# Patient Record
Sex: Female | Born: 1955 | Race: White | Hispanic: No | Smoking: Former smoker
Health system: Southern US, Community
[De-identification: ages and names within clinical notes are randomized; demographics above are authoritative.]

## PROBLEM LIST (undated history)

## (undated) DIAGNOSIS — E785 Hyperlipidemia, unspecified: Secondary | ICD-10-CM

## (undated) DIAGNOSIS — I10 Essential (primary) hypertension: Secondary | ICD-10-CM

## (undated) HISTORY — PX: TUBAL LIGATION: SHX77

## (undated) HISTORY — PX: APPENDECTOMY: SHX54

---

## 2015-12-22 ENCOUNTER — Telehealth: Payer: Self-pay | Admitting: Pulmonary Disease

## 2015-12-22 NOTE — Telephone Encounter (Signed)
APT. REMINDER CALL, LMTCB °

## 2015-12-25 ENCOUNTER — Ambulatory Visit: Payer: Self-pay | Admitting: Pulmonary Disease

## 2016-02-12 ENCOUNTER — Encounter: Payer: Self-pay | Admitting: Internal Medicine

## 2016-02-12 ENCOUNTER — Ambulatory Visit (INDEPENDENT_AMBULATORY_CARE_PROVIDER_SITE_OTHER): Payer: Self-pay | Admitting: Internal Medicine

## 2016-02-12 DIAGNOSIS — IMO0001 Reserved for inherently not codable concepts without codable children: Secondary | ICD-10-CM

## 2016-02-12 DIAGNOSIS — M1712 Unilateral primary osteoarthritis, left knee: Secondary | ICD-10-CM

## 2016-02-12 DIAGNOSIS — R030 Elevated blood-pressure reading, without diagnosis of hypertension: Secondary | ICD-10-CM

## 2016-02-12 DIAGNOSIS — M25562 Pain in left knee: Secondary | ICD-10-CM

## 2016-02-12 DIAGNOSIS — I10 Essential (primary) hypertension: Secondary | ICD-10-CM | POA: Insufficient documentation

## 2016-02-12 MED ORDER — DICLOFENAC SODIUM 1 % TD GEL: 4.0000 g | Freq: Four times a day (QID) | TRANSDERMAL | 2 refills | Status: AC

## 2016-02-12 NOTE — Progress Notes (Signed)
   CC: left knee pain HPI: [name] is a 60 y.o. woman with no significant PMH here for left knee pain.  Please see Problem List/A&P for the status of the patient's chronic medical problems   No past medical history on file.  Review of Systems:  Constitutional: Negative for fever, chills, has gained 30 lbs over last year which she attributes to her diet Respiratory: Negative for cough, shortness of breath and wheezing.  Musculoskeletal: Negative for myalgias, has left knee pain  Physical Exam: Vitals:   02/12/16 0925  BP: (!) 153/103  Pulse: 72  Temp: 98.2 F (36.8 C)  TempSrc: Oral  SpO2: 97%  Weight: 170 lb (77.1 kg)  Height: 5' 5.5" (1.664 m)    General: A&O, in NAD CV: RRR, normal s1, s2, no m/r/g Resp: equal and symmetric breath sounds, no wheezing or crackles  Abdomen: soft, nontender, nondistended, +BS Extremities: pulses intact b/l, no edema,  Left knee: nontender to palpation, no effusion. Has crepitus on extension,. Negative anterior and posterior drawer test   Assessment & Plan:   See encounters tab for problem based medical decision making. Patient discussed with Dr. Criselda Peaches

## 2016-02-12 NOTE — Assessment & Plan Note (Signed)
Patient presents with few months duration of left knee pain. She said she had a torn meniscus and subsequent surgery at least 10 years ago. She said her husband had passed away few years ago, and after that she went into depression and as a result gained a lot of weight and her knee pain worsened after that. SHe lost some of that weight but still is 30 lbs more than her baseline of 140 lbs.  She said the pain is off and on and is dull. She takes ibuprofen and tylenol intermittently which helps the pain. THe pain is worse after walking and improves when sitting down.  She denies any acute recent injury or fall. She denies any other joint pain. She says the pain is there for about few months now and now has associated back and leg pain.  On exam, significant crepitus of the left knee on ROM. No effusions, erythema or warmth.   A: Osteoarthritis of left knee  P: -Xray of left knee -Voltaren gel -aerobic low impact exercise- explained bicycling, swimming and walking -can take tylenol not to exceed 3g in 24 hours -RTC in 2-4 weeks for follow up for her elevated BP

## 2016-02-12 NOTE — Assessment & Plan Note (Signed)
BP of 153/100. She said she used to be on hctz >10 years ago but not on it. SHe denies visiting a physician since then. Denies any headaches or blurry vision.  -Will need several elevated BP readings to diagnose hypertension.   -Explained to measure BP whenever she goes to a pharmacy as she can not afford a cuff, and write it down in log -RTC in 2-4 weeks for repeat BP - Can start hctz if confirmed HTN, BMET prior to that

## 2016-02-12 NOTE — Patient Instructions (Signed)
Thank you for your visit today Please do the xray of the left knee Please use voltaren gel as needed Please limit the use of tylenol and ibuprofen Please do aerobic exercise but avoid repetitive motion of the knee Please follow up in 2-4 weeks

## 2016-02-21 NOTE — Progress Notes (Signed)
Internal Medicine Clinic Attending  Case discussed with Dr. Saraiya at the time of the visit.  We reviewed the resident's history and exam and pertinent patient test results.  I agree with the assessment, diagnosis, and plan of care documented in the resident's note.  

## 2016-02-29 ENCOUNTER — Telehealth: Payer: Self-pay | Admitting: *Deleted

## 2016-02-29 NOTE — Telephone Encounter (Signed)
"  I was walking up the stairs and something popped in my knee." Pain in knee has significantly increased. Patient went to [employer] to check blood pressure and had a reading of 171/100. Patient took B/P immediately after getting inside and said her knee was hurting "terribly". She thinks her B/P is elevated due to pain. Denies HA, blurry vision, chest pain or SOB. Says "I feel fine except my knee". Offered patient an appointment but declined due to financial reasons and did not want to drive. Offered to move next appointment up and patient still declined because she does not get paid until 03/08/2016. Encouraged patient to call us back if pain worsens or to go to ED for any emergent symptoms. Such as chest pain, shortness of breath, weakness, severe HA. Patient agreed. Encouraged patient to use Voltaren gel and rest the knee and try ice to relieve pain.

## 2016-03-01 ENCOUNTER — Encounter (HOSPITAL_COMMUNITY): Payer: Self-pay | Admitting: Emergency Medicine

## 2016-03-01 ENCOUNTER — Emergency Department (HOSPITAL_COMMUNITY)
Admission: EM | Admit: 2016-03-01 | Discharge: 2016-03-01 | Disposition: A | Discharge status: Home / Self Care | Payer: Self-pay | Attending: Emergency Medicine | Admitting: Emergency Medicine

## 2016-03-01 ENCOUNTER — Emergency Department (HOSPITAL_COMMUNITY): Payer: Self-pay

## 2016-03-01 DIAGNOSIS — Z87891 Personal history of nicotine dependence: Secondary | ICD-10-CM | POA: Insufficient documentation

## 2016-03-01 DIAGNOSIS — I10 Essential (primary) hypertension: Secondary | ICD-10-CM | POA: Insufficient documentation

## 2016-03-01 DIAGNOSIS — M25562 Pain in left knee: Secondary | ICD-10-CM | POA: Insufficient documentation

## 2016-03-01 LAB — CBC WITH DIFFERENTIAL/PLATELET
BASOS ABS: 0 10*3/uL (ref 0.0–0.1)
BASOS PCT: 0 %
EOS ABS: 0.1 10*3/uL (ref 0.0–0.7)
EOS PCT: 2 %
HCT: 45.4 % (ref 36.0–46.0)
HEMOGLOBIN: 15.5 g/dL — AB (ref 12.0–15.0)
LYMPHS ABS: 2.8 10*3/uL (ref 0.7–4.0)
Lymphocytes Relative: 34 %
MCH: 30.4 pg (ref 26.0–34.0)
MCHC: 34.1 g/dL (ref 30.0–36.0)
MCV: 89 fL (ref 78.0–100.0)
Monocytes Absolute: 0.7 10*3/uL (ref 0.1–1.0)
Monocytes Relative: 9 %
NEUTROS PCT: 55 %
Neutro Abs: 4.5 10*3/uL (ref 1.7–7.7)
PLATELETS: 283 10*3/uL (ref 150–400)
RBC: 5.1 MIL/uL (ref 3.87–5.11)
RDW: 12.4 % (ref 11.5–15.5)
WBC: 8.1 10*3/uL (ref 4.0–10.5)

## 2016-03-01 LAB — BASIC METABOLIC PANEL
ANION GAP: 6 (ref 5–15)
BUN: 16 mg/dL (ref 6–20)
CHLORIDE: 104 mmol/L (ref 101–111)
CO2: 27 mmol/L (ref 22–32)
Calcium: 9.7 mg/dL (ref 8.9–10.3)
Creatinine, Ser: 0.74 mg/dL (ref 0.44–1.00)
Glucose, Bld: 93 mg/dL (ref 65–99)
POTASSIUM: 4.2 mmol/L (ref 3.5–5.1)
SODIUM: 137 mmol/L (ref 135–145)

## 2016-03-01 MED ORDER — HYDROCODONE-ACETAMINOPHEN 5-325 MG PO TABS: 1.0000 | ORAL_TABLET | Freq: Four times a day (QID) | ORAL | 0 refills | Status: AC | PRN

## 2016-03-01 MED ORDER — HYDROCHLOROTHIAZIDE 25 MG PO TABS: 25.0000 mg | ORAL_TABLET | Freq: Every day | ORAL | 1 refills | Status: DC

## 2016-03-01 MED ORDER — HYDROMORPHONE HCL 1 MG/ML IJ SOLN
2.0000 mg | Freq: Once | INTRAMUSCULAR | Status: AC
Start: 2016-03-01 — End: 2016-03-01
  Administered 2016-03-01: 2 mg via INTRAMUSCULAR
  Filled 2016-03-01: qty 2

## 2016-03-01 NOTE — ED Triage Notes (Signed)
[facility]  Has a h/a is  On no meds  Also her knee is in pain from when she hurt it walking her dog  2 days ago

## 2016-03-01 NOTE — Discharge Instructions (Signed)
Make an appointment to follow-up with the internal medicine clinic for recheck your blood pressure. Start the hydrochlorothiazide for the elevated blood pressure take 1 pill daily. For the left knee pain take the hydrocodone as needed.

## 2016-03-01 NOTE — ED Provider Notes (Signed)
[facility] DEPT Provider Note   CSN: [id number] Arrival date & time: 03/01/16  1321     History   Chief Complaint Chief Complaint  Patient presents with  . Knee Pain  . Hypertension    HPI Karla Johnson is a 60 y.o. female.  [facility]. Patient's been being evaluated for possible hypertension. In the following her blood pressures. Patient with increased pain today with her left knee. She's had trouble with that in the past had a twisting injury but no acute injury to the knee. Patient with increased pain. Blood pressure also elevated [facility]. Patient denies chest pain shortness of breath. No neurological focal deficits. No significant headache. Patient states there was a twisting injury to the knee. With a popping sensation 2 days ago. Did not completely fall.      History reviewed. No pertinent past medical history.  Patient Active Problem List   Diagnosis Date Noted  . Left knee pain 02/12/2016  . Elevated blood pressure 02/12/2016    History reviewed. No pertinent surgical history.  OB History    No data available       Home Medications    Prior to Admission medications   Medication Sig Start Date End Date Taking? Authorizing Provider  ibuprofen (ADVIL,MOTRIN) 200 MG tablet Take 200 mg by mouth every 6 (six) hours as needed.   Yes Historical Provider, MD  diclofenac sodium (VOLTAREN) 1 % GEL Apply 4 g topically 4 (four) times daily. Patient not taking: Reported on 03/01/2016 02/12/16   Deneise LeverParth Saraiya, MD  hydrochlorothiazide (HYDRODIURIL) 25 MG tablet Take 1 tablet (25 mg total) by mouth daily. 03/01/16   Vanetta MuldersScott Millard Bautch, MD  HYDROcodone-acetaminophen (NORCO/VICODIN) 5-325 MG tablet Take 1-2 tablets by mouth every 6 (six) hours as needed for moderate pain. 03/01/16   Vanetta MuldersScott Floris Neuhaus, MD    Family History No family history on file.  Social History Social  History  Substance Use Topics  . Smoking status: Former Smoker    Quit date: 02/11/1977  . Smokeless tobacco: Never Used  . Alcohol use Not on file     Allergies   Review of patient's allergies indicates no known allergies.   Review of Systems Review of Systems  Constitutional: Negative for fever.  HENT: Negative for congestion.   Eyes: Negative for visual disturbance.  Respiratory: Negative for shortness of breath.   Cardiovascular: Negative for chest pain and leg swelling.  Gastrointestinal: Negative for abdominal pain.  Genitourinary: Negative for dysuria.  Musculoskeletal: Positive for arthralgias and joint swelling.  Skin: Negative for rash.  Neurological: Negative for weakness, numbness and headaches.  Hematological: Does not bruise/bleed easily.  Psychiatric/Behavioral: Negative for confusion.     Physical Exam Updated Vital Signs BP 162/95   Pulse (!) 57   Temp 98.4 F (36.9 C) (Oral)   Resp 19   Ht 5\' 6"  (1.676 m)   Wt 77.1 kg   SpO2 93%   BMI 27.44 kg/m   Physical Exam  Constitutional: She is oriented to person, place, and time. She appears well-developed and well-nourished. No distress.  HENT:  Head: Normocephalic and atraumatic.  Eyes: Conjunctivae and EOM are normal. Pupils are equal, round, and reactive to light.  Neck: Normal range of motion. Neck supple.  Cardiovascular: Normal rate, regular rhythm, normal heart sounds and intact distal pulses.   Pulmonary/Chest: Effort normal and breath sounds normal.  Abdominal: Soft. Bowel sounds  are normal.  Musculoskeletal: Normal range of motion. She exhibits edema.  Left knee with very slight edema. No patellar dislocation no joint line tenderness. Good range of motion. No erythema edema. Distally neurovascularly intact. Right knee without any significant abnormalities as well.  Neurological: She is alert and oriented to person, place, and time. No cranial nerve deficit. She exhibits normal muscle tone.  Coordination normal.  Skin: Skin is warm. No erythema.  Nursing note and vitals reviewed.    ED Treatments / Results  Labs (all labs ordered are listed, but only abnormal results are displayed) Labs Reviewed  CBC WITH DIFFERENTIAL/PLATELET - Abnormal; Notable for the following:       Result Value   Hemoglobin 15.5 (*)    All other components within normal limits  BASIC METABOLIC PANEL   Results for orders placed or performed during the hospital encounter of 03/01/16  CBC with Differential/Platelet  Result Value Ref Range   WBC 8.1 4.0 - 10.5 K/uL   RBC 5.10 3.87 - 5.11 MIL/uL   Hemoglobin 15.5 (H) 12.0 - 15.0 g/dL   HCT 78.245.4 95.636.0 - 21.346.0 %   MCV 89.0 78.0 - 100.0 fL   MCH 30.4 26.0 - 34.0 pg   MCHC 34.1 30.0 - 36.0 g/dL   RDW 08.612.4 57.811.5 - 46.915.5 %   Platelets 283 150 - 400 K/uL   Neutrophils Relative % 55 %   Neutro Abs 4.5 1.7 - 7.7 K/uL   Lymphocytes Relative 34 %   Lymphs Abs 2.8 0.7 - 4.0 K/uL   Monocytes Relative 9 %   Monocytes Absolute 0.7 0.1 - 1.0 K/uL   Eosinophils Relative 2 %   Eosinophils Absolute 0.1 0.0 - 0.7 K/uL   Basophils Relative 0 %   Basophils Absolute 0.0 0.0 - 0.1 K/uL  Basic metabolic panel  Result Value Ref Range   Sodium 137 135 - 145 mmol/L   Potassium 4.2 3.5 - 5.1 mmol/L   Chloride 104 101 - 111 mmol/L   CO2 27 22 - 32 mmol/L   Glucose, Bld 93 65 - 99 mg/dL   BUN 16 6 - 20 mg/dL   Creatinine, Ser 6.290.74 0.44 - 1.00 mg/dL   Calcium 9.7 8.9 - 52.810.3 mg/dL   GFR calc non Af Amer >60 >60 mL/min   GFR calc Af Amer >60 >60 mL/min   Anion gap 6 5 - 15     EKG  EKG Interpretation None       Radiology Dg Knee Complete 4 Views Left  Result Date: 03/01/2016 CLINICAL DATA:  Pain and swelling in left knee. Twisting injury 2 days ago. EXAM: LEFT KNEE - COMPLETE 4+ VIEW COMPARISON:  None. FINDINGS: Degenerative changes in all 3 compartments of the left knee with joint space narrowing and spurring. No acute bony abnormality. Specifically, no  fracture, subluxation, or dislocation. Soft tissues are intact. No joint effusion. IMPRESSION: Moderate tricompartment degenerative changes. No acute bony abnormality. Electronically Signed   By: Charlett NoseKevin  Dover M.D.   On: 03/01/2016 15:51    Procedures Procedures (including critical care time)  Medications Ordered in ED Medications  HYDROmorphone (DILAUDID) injection 2 mg (2 mg Intramuscular Given 03/01/16 1914)     Initial Impression / Assessment and Plan / ED Course  I have reviewed the triage vital signs and the nursing notes.  Pertinent labs & imaging results that were available during my care of the patient were reviewed by me and considered in my medical decision making (see chart  for details).  Clinical Course    Patient with complaint of left knee pain on and off for a period of time. X-rays consistent with arthritic changes there. Patient [place] treated with when necessary hydrocodone. Patient also was followed by internal medicine clinic here [facility] and patient has been told that she has had elevated blood pressure. They have been following that. Based on her findings today most likely does have hypertension requiring blood pressure medication. Did not improve significantly with control of the pain from the left knee. Patient did receive 1 mg of hydromorphone. This improved the left knee pain but did not make a significant dent in the open to control her blood pressure. So patient probably truly is hypertensive. Patient's basic labs without any significant electrolyte abnormalities. We'll start patient on hydrochlorothiazide. She will follow up with the internal medicine clinic.   Final Clinical Im 5 family pressions(s) / ED Diagnoses   Final diagnoses:  Left knee pain  Essential hypertension    New Prescriptions New Prescriptions   HYDROCHLOROTHIAZIDE (HYDRODIURIL) 25 MG TABLET    Take 1 tablet (25 mg total) by mouth daily.   HYDROCODONE-ACETAMINOPHEN (NORCO/VICODIN) 5-325  MG TABLET    Take 1-2 tablets by mouth every 6 (six) hours as needed for moderate pain.     Vanetta Mulders, MD 03/01/16 2013

## 2016-03-07 ENCOUNTER — Telehealth: Payer: Self-pay | Admitting: Internal Medicine

## 2016-03-07 NOTE — Telephone Encounter (Signed)
APT. REMINDER CALL, LMTCB °

## 2016-03-08 ENCOUNTER — Telehealth: Payer: Self-pay

## 2016-03-08 ENCOUNTER — Ambulatory Visit (INDEPENDENT_AMBULATORY_CARE_PROVIDER_SITE_OTHER): Payer: Self-pay | Admitting: Internal Medicine

## 2016-03-08 DIAGNOSIS — I10 Essential (primary) hypertension: Secondary | ICD-10-CM

## 2016-03-08 DIAGNOSIS — Z79899 Other long term (current) drug therapy: Secondary | ICD-10-CM

## 2016-03-08 MED ORDER — HYDROCHLOROTHIAZIDE 25 MG PO TABS: 25.0000 mg | ORAL_TABLET | Freq: Every day | ORAL | 1 refills | Status: DC

## 2016-03-08 MED ORDER — NAPROXEN 500 MG PO TABS: 500.0000 mg | ORAL_TABLET | Freq: Two times a day (BID) | ORAL | 0 refills | Status: AC

## 2016-03-08 NOTE — Telephone Encounter (Signed)
States she was seen today- needs something for knee pain not necessary Hydrocdone. Talked w/ Dr Yisroel RammingSarariya who states he will speak to pt.

## 2016-03-08 NOTE — Patient Instructions (Signed)
Thank you for your visit today  Please continue taking the hydrochlorothiazide medicine for your blood pressure  Please continue recording your blood pressure at home (in [facility], or if you buy a cuff then at home)- it is the best way for us to know what your blood pressure runs like  Please follow up in 4 weeks

## 2016-03-08 NOTE — Telephone Encounter (Signed)
Questions about hydrocodone. Please call pt back.

## 2016-03-08 NOTE — Progress Notes (Signed)
   CC:  Hypertension follow up HPI: [name] is a 60 y.o. woman with PMH noted below here for hypertension follow up  Please see Problem List/A&P for the status of the patient's chronic medical problems   No past medical history on file.  Review of Systems:  Constitutional: Negative for fever, chills, weight loss and malaise/fatigue.  Denies n/v Has some left knee pain but better   Physical Exam: Vitals:   03/08/16 1039 03/08/16 1041  BP: (!) 165/92 130/80  Pulse: 91 70  Temp: 98.7 F (37.1 C)   TempSrc: Oral   SpO2: 99%   Weight: 167 lb 12.8 oz (76.1 kg)   Height: 5' 5.5" (1.664 m)     General: A&O, in NAD, knee has a brace CV: RRR, normal s1, s2, no m/r/g, no carotid bruits appreciated Resp: equal and symmetric breath sounds, no wheezing or crackles  Abdomen: soft, nontender, nondistended, +BS   Assessment & Plan:   See encounters tab for problem based medical decision making. Patient discussed with Dr. Criselda Johnson

## 2016-03-08 NOTE — Progress Notes (Signed)
Internal Medicine Clinic Attending  Case discussed with Dr. Saraiya at the time of the visit.  We reviewed the resident's history and exam and pertinent patient test results.  I agree with the assessment, diagnosis, and plan of care documented in the resident's note.  

## 2016-03-08 NOTE — Assessment & Plan Note (Signed)
At the last visit on July 31, pt's blood pressure was elevated and she was asked to keep a log with few readings to see if her at home blood pressure continues to be elevated.  Her at-home readings were int he 160 and 150 and diastolic in the 90s.[facility]  She went to ER on Aug 18th for knee pain and they had started her on HCTZ 25 mg daily.  Today her initial blood pressure is 165/92, and repeat number is 130/80, which is at goal.  Assessment: Essential hypertension, controlled on hctz  Plan -continue hctz 25 mg daily -asked her to continue monitoring BP at home and advised her to buy a meter for home and bring it to clinic for calibration -advised low salt diet,exercise. Pt does not smoke now or in past

## 2016-03-08 NOTE — Telephone Encounter (Signed)
Returned pt's call - no answer; message left.

## 2016-03-08 NOTE — Addendum Note (Signed)
Addended by: Deneise LeverSARAIYA, Perfecto Purdy A on: 03/08/2016 03:51 PM   Modules accepted: Orders

## 2016-04-29 ENCOUNTER — Other Ambulatory Visit: Payer: Self-pay | Admitting: Internal Medicine

## 2016-05-10 ENCOUNTER — Other Ambulatory Visit: Payer: Self-pay | Admitting: Obstetrics and Gynecology

## 2016-05-10 DIAGNOSIS — Z1231 Encounter for screening mammogram for malignant neoplasm of breast: Secondary | ICD-10-CM

## 2016-05-20 ENCOUNTER — Emergency Department (HOSPITAL_COMMUNITY)
Admission: EM | Admit: 2016-05-20 | Discharge: 2016-05-20 | Disposition: A | Discharge status: Home / Self Care | Payer: Self-pay | Attending: Emergency Medicine | Admitting: Emergency Medicine

## 2016-05-20 ENCOUNTER — Encounter (HOSPITAL_COMMUNITY): Payer: Self-pay | Admitting: *Deleted

## 2016-05-20 DIAGNOSIS — Z87891 Personal history of nicotine dependence: Secondary | ICD-10-CM | POA: Insufficient documentation

## 2016-05-20 DIAGNOSIS — Z79899 Other long term (current) drug therapy: Secondary | ICD-10-CM | POA: Insufficient documentation

## 2016-05-20 DIAGNOSIS — K0889 Other specified disorders of teeth and supporting structures: Secondary | ICD-10-CM | POA: Insufficient documentation

## 2016-05-20 DIAGNOSIS — I10 Essential (primary) hypertension: Secondary | ICD-10-CM | POA: Insufficient documentation

## 2016-05-20 HISTORY — DX: Essential (primary) hypertension: I10

## 2016-05-20 MED ORDER — LIDOCAINE VISCOUS 2 % MT SOLN: 15.0000 mL | OROMUCOSAL | 2 refills | Status: AC | PRN

## 2016-05-20 MED ORDER — NAPROXEN 500 MG PO TABS: 500.0000 mg | ORAL_TABLET | Freq: Two times a day (BID) | ORAL | 0 refills | Status: AC

## 2016-05-20 MED ORDER — PENICILLIN V POTASSIUM 500 MG PO TABS: 500.0000 mg | ORAL_TABLET | Freq: Four times a day (QID) | ORAL | 0 refills | Status: AC

## 2016-05-20 MED ORDER — BUPIVACAINE-EPINEPHRINE (PF) 0.5% -1:200000 IJ SOLN
1.8000 mL | Freq: Once | INTRAMUSCULAR | Status: AC
  Administered 2016-05-20: 1.8 mL
  Filled 2016-05-20: qty 1.8

## 2016-05-20 NOTE — ED Triage Notes (Signed)
PT reports a week HX of dental pain and facial swelling.

## 2016-05-20 NOTE — ED Notes (Signed)
Declined W/C at D/C and was escorted to lobby by RN. 

## 2016-05-20 NOTE — Discharge Instructions (Signed)
You have been seen today for dental pain. You should follow up with a dentist as soon as possible. This problem will not resolve on its own without the care of a dentist. Use ibuprofen or naproxen for pain. Take all of the antibiotics in their entirety. Use the viscous lidocaine for mouth pain. Swish with the lidocaine and spit it out. Do not swallow it.

## 2016-05-20 NOTE — ED Provider Notes (Signed)
[facility] Provider Note   CSN: [id number] Arrival date & time: 05/20/16  [id number]     History   Chief Complaint Chief Complaint  Patient presents with  . Dental Pain    HPI Karla Johnson is a 60 y.o. female.  HPI   Karla Johnson is a 60 y.o. female, with a history of HTN, presenting to the ED with left lower dental pain for the past week. Also endorses some facial swelling. Pain is throbbing and severe. Has tried swishing with peroxide and taking ibuprofen with no improvement. Patient is currently trying to get in with a dentist, but has not been successful. Denies fever/chills, N/V, difficulty breathing or swallowing, or any other complaints.       Past Medical History:  Diagnosis Date  . Hypertension     Patient Active Problem List   Diagnosis Date Noted  . Left knee pain 02/12/2016  . Essential hypertension 02/12/2016    History reviewed. No pertinent surgical history.  OB History    No data available       Home Medications    Prior to Admission medications   Medication Sig Start Date End Date Taking? Authorizing Provider  diclofenac sodium (VOLTAREN) 1 % GEL Apply 4 g topically 4 (four) times daily. Patient not taking: Reported on 03/01/2016 02/12/16   Deneise LeverParth Saraiya, MD  hydrochlorothiazide (HYDRODIURIL) 25 MG tablet TAKE ONE TABLET BY MOUTH ONCE DAILY 05/03/16   Bethany Molt, DO  HYDROcodone-acetaminophen (NORCO/VICODIN) 5-325 MG tablet Take 1-2 tablets by mouth every 6 (six) hours as needed for moderate pain. 03/01/16   Vanetta MuldersScott Zackowski, MD  ibuprofen (ADVIL,MOTRIN) 200 MG tablet Take 200 mg by mouth every 6 (six) hours as needed.    Historical Provider, MD  lidocaine (XYLOCAINE) 2 % solution Use as directed 15 mLs in the mouth or throat as needed for mouth pain. 05/20/16   Kayin Kettering C Dandy Lazaro, PA-C  naproxen (NAPROSYN) 500 MG tablet Take 1 tablet (500 mg total) by mouth 2 (two) times daily with a meal. 03/08/16 03/08/17  Deneise LeverParth Saraiya, MD  naproxen (NAPROSYN)  500 MG tablet Take 1 tablet (500 mg total) by mouth 2 (two) times daily. 05/20/16   Iqra Rotundo C Shalik Sanfilippo, PA-C  penicillin v potassium (VEETID) 500 MG tablet Take 1 tablet (500 mg total) by mouth 4 (four) times daily. 05/20/16 05/27/16  Anselm PancoastShawn C Yaquelin Langelier, PA-C    Family History History reviewed. No pertinent family history.  Social History Social History  Substance Use Topics  . Smoking status: Former Smoker    Quit date: 02/11/1977  . Smokeless tobacco: Never Used  . Alcohol use No     Allergies   Patient has no known allergies.   Review of Systems Review of Systems  Constitutional: Negative for chills and fever.  HENT: Positive for dental problem and facial swelling (subjective). Negative for trouble swallowing and voice change.   Respiratory: Negative for shortness of breath.   Gastrointestinal: Negative for nausea and vomiting.  All other systems reviewed and are negative.    Physical Exam Updated Vital Signs BP 164/97 (BP Location: Right Arm)   Pulse 81   Temp 98.2 F (36.8 C) (Oral)   Resp 16   Ht 5\' 6"  (1.676 m)   Wt 76.2 kg   SpO2 100%   BMI 27.12 kg/m   Physical Exam  Constitutional: She appears well-developed and well-nourished. No distress.  HENT:  Head: Normocephalic and atraumatic.  Tenderness near the left mandibular first premolar. Poor dentition throughout.  Multiple teeth worn down to the gum line. No discernable area of fluctuance to suggest a drainable abscess. No trismus. Readily handles oral secretions without difficulty.  Eyes: Conjunctivae are normal.  Neck: Normal range of motion. Neck supple.  Cardiovascular: Normal rate, regular rhythm and intact distal pulses.   Pulmonary/Chest: Effort normal and breath sounds normal. No respiratory distress.  Abdominal: Soft. There is no tenderness. There is no guarding.  Musculoskeletal: She exhibits no edema.  Lymphadenopathy:    She has no cervical adenopathy.  Neurological: She is alert.  Skin: Skin is warm and  dry. She is not diaphoretic.  Psychiatric: She has a normal mood and affect. Her behavior is normal.  Nursing note and vitals reviewed.    ED Treatments / Results  Labs (all labs ordered are listed, but only abnormal results are displayed) Labs Reviewed - No data to display  EKG  EKG Interpretation None       Radiology No results found.  Procedures Dental Block Date/Time: 05/20/2016 7:38 AM Performed by: Anselm PancoastJOY, Kodee Ravert C Authorized by: Anselm PancoastJOY, Levaughn Puccinelli C   Consent:    Consent obtained:  Verbal   Consent given by:  Patient   Risks discussed:  Allergic reaction, infection, swelling, unsuccessful block and pain   Alternatives discussed:  No treatment and alternative treatment Indications:    Indications: dental pain   Location:    Block type:  Inferior alveolar   Laterality:  Left Procedure details (see MAR for exact dosages):    Syringe type:  Controlled syringe   Needle gauge:  27 G   Anesthetic injected:  Bupivacaine 0.5% WITH epi   Injection procedure:  Anatomic landmarks identified, anatomic landmarks palpated, introduced needle and incremental injection Post-procedure details:    Outcome:  Pain improved   Patient tolerance of procedure:  Tolerated well, no immediate complications    (including critical care time)  Medications Ordered in ED Medications  bupivacaine-epinephrine (MARCAINE W/ EPI) 0.5% -1:200000 injection 1.8 mL (1.8 mLs Infiltration Given 05/20/16 [id number])     Initial Impression / Assessment and Plan / ED Course  I have reviewed the triage vital signs and the nursing notes.  Pertinent labs & imaging results that were available during my care of the patient were reviewed by me and considered in my medical decision making (see chart for details).  Clinical Course     Patient presents with dental pain for the last week. No signs of an abscess that would benefit from a bedside I&D. Dental block performed without immediate complication. Patient advised to  follow-up with a dentist as soon as possible.     Final Clinical Impressions(s) / ED Diagnoses   Final diagnoses:  Pain, dental    New Prescriptions New Prescriptions   LIDOCAINE (XYLOCAINE) 2 % SOLUTION    Use as directed 15 mLs in the mouth or throat as needed for mouth pain.   NAPROXEN (NAPROSYN) 500 MG TABLET    Take 1 tablet (500 mg total) by mouth 2 (two) times daily.   PENICILLIN V POTASSIUM (VEETID) 500 MG TABLET    Take 1 tablet (500 mg total) by mouth 4 (four) times daily.     Anselm PancoastShawn C Aina Rossbach, PA-C 05/20/16 [id number]    Jacalyn LefevreJulie Haviland, MD 05/20/16 1031

## 2016-06-13 ENCOUNTER — Ambulatory Visit
Admission: RE | Admit: 2016-06-13 | Discharge: 2016-06-13 | Disposition: A | Discharge status: Home / Self Care | Payer: No Typology Code available for payment source | Source: Ambulatory Visit | Attending: Obstetrics and Gynecology | Admitting: Obstetrics and Gynecology

## 2016-06-13 ENCOUNTER — Ambulatory Visit (HOSPITAL_COMMUNITY)
Admission: RE | Admit: 2016-06-13 | Discharge: 2016-06-13 | Disposition: A | Discharge status: Home / Self Care | Payer: Self-pay | Source: Ambulatory Visit | Attending: Obstetrics and Gynecology | Admitting: Obstetrics and Gynecology

## 2016-06-13 ENCOUNTER — Encounter (HOSPITAL_COMMUNITY): Payer: Self-pay

## 2016-06-13 VITALS — BP 142/98 | Temp 98.2°F | Ht 66.0 in | Wt 171.8 lb

## 2016-06-13 DIAGNOSIS — Z1231 Encounter for screening mammogram for malignant neoplasm of breast: Secondary | ICD-10-CM

## 2016-06-13 DIAGNOSIS — Z01419 Encounter for gynecological examination (general) (routine) without abnormal findings: Secondary | ICD-10-CM

## 2016-06-13 HISTORY — DX: Hyperlipidemia, unspecified: E78.5

## 2016-06-13 NOTE — Progress Notes (Addendum)
No complaints today.   Pap Smear: Pap smear completed today. Last Pap smear was 27 years ago and normal per patient. Per patient has no history of an abnormal Pap smear. No Pap smear results are in EPIC.  Physical exam: Breasts Breasts symmetrical. No skin abnormalities bilateral breasts. No nipple retraction bilateral breasts. No nipple discharge bilateral breasts. No lymphadenopathy. No lumps palpated bilateral breasts. No complaints of pain or tenderness on exam. [facility]. Appointment scheduled for Thursday, June 13, 2016 at 1130.  Pelvic/Bimanual   Ext Genitalia No lesions, no swelling and no discharge observed on external genitalia.         Vagina Vagina pink and normal texture. No lesions or discharge observed in vagina.          Cervix Cervix is present. Cervix pink and of normal texture. No discharge observed.     Uterus Uterus is present and palpable. Uterus in normal position and normal size.        Adnexae Bilateral ovaries present and palpable. No tenderness on palpation.          Rectovaginal No rectal exam completed today since patient had no rectal complaints. No skin abnormalities observed on exam.    Smoking History: Patient has never smoked.  Patient Navigation: Patient education provided. Access to services provided for patient through [facility] program.   Colorectal Cancer Screening: Per patient has never had a colonoscopy completed. No complaints today.

## 2016-06-13 NOTE — Progress Notes (Signed)
Pap smear completed

## 2016-06-13 NOTE — Patient Instructions (Signed)
Explained breast self awareness to Karla Johnson. Let patient know [facility] will cover Pap smears and HPV typing every 5 years unless has a history of abnormal Pap smears. [facility] for [facility] screening mammogram. Appointment scheduled for Thursday, June 13, 2016 at 1130. Let patient know will follow up with her within the next couple weeks with results of Pap smear by phone. Informed patient that the [facility] will follow up with her within the next couple of weeks with results to mammogram by letter or phone. Karla Johnson verbalized understanding.  Linnet Bottari, Kathaleen Maserhristine Poll, RN 1:24 PM

## 2016-06-14 ENCOUNTER — Encounter (HOSPITAL_COMMUNITY): Payer: Self-pay | Admitting: *Deleted

## 2016-06-18 LAB — CYTOLOGY - PAP
Diagnosis: NEGATIVE
HPV: NOT DETECTED

## 2016-06-24 ENCOUNTER — Telehealth (HOSPITAL_COMMUNITY): Payer: Self-pay | Admitting: *Deleted

## 2016-06-24 NOTE — Telephone Encounter (Signed)
Telephoned patient at home number and left message to return call to [facility] 

## 2016-06-24 NOTE — Addendum Note (Signed)
Encounter addended by: Priscille Heidelberghristine P Mekiah Wahler, RN on: 06/24/2016  9:51 AM<BR>    Actions taken: Sign clinical note

## 2016-06-28 ENCOUNTER — Telehealth (HOSPITAL_COMMUNITY): Payer: Self-pay | Admitting: *Deleted

## 2016-06-28 NOTE — Telephone Encounter (Signed)
Telephoned patient at home number and left message to return call to [facility] 

## 2016-07-02 ENCOUNTER — Encounter (HOSPITAL_COMMUNITY): Payer: Self-pay | Admitting: *Deleted

## 2016-07-02 ENCOUNTER — Telehealth (HOSPITAL_COMMUNITY): Payer: Self-pay | Admitting: *Deleted

## 2016-07-02 NOTE — Progress Notes (Signed)
Pap result letter mailed to patient. Pap smear normal. Next pap smear due in five years.

## 2016-07-02 NOTE — Telephone Encounter (Signed)
Telephoned patient at home number and left message to return call to [facility] 

## 2017-07-22 ENCOUNTER — Other Ambulatory Visit: Payer: Self-pay | Admitting: Obstetrics and Gynecology

## 2017-08-17 IMAGING — CR DG KNEE COMPLETE 4+V*L*
4 series · 4 of 4 positions shown · non-contrast
Comparison: None.

CLINICAL DATA: Pain and swelling in left knee. Twisting injury 2
days ago.

EXAM:
LEFT KNEE - COMPLETE 4+ VIEW

[knee ap]
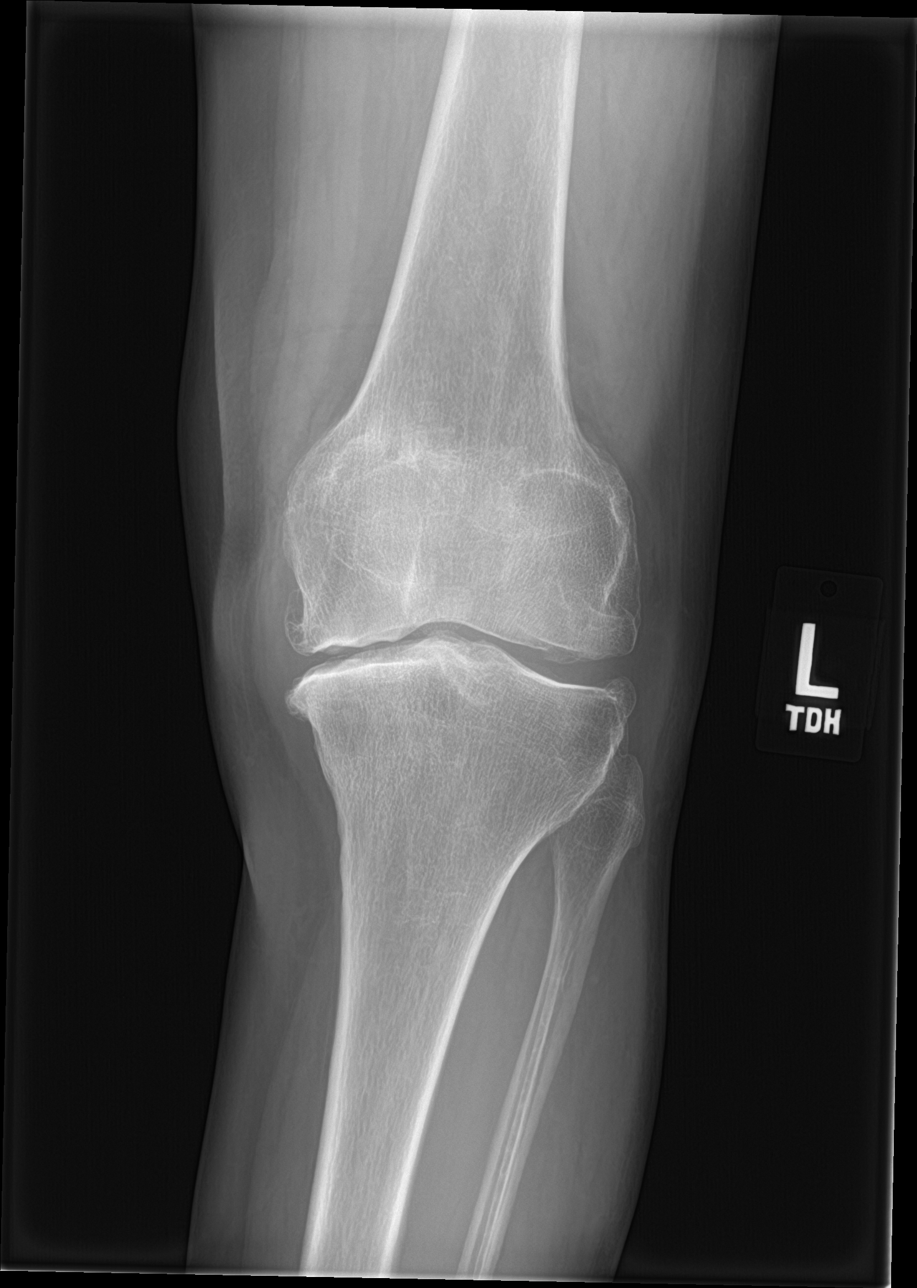

[knee lat]
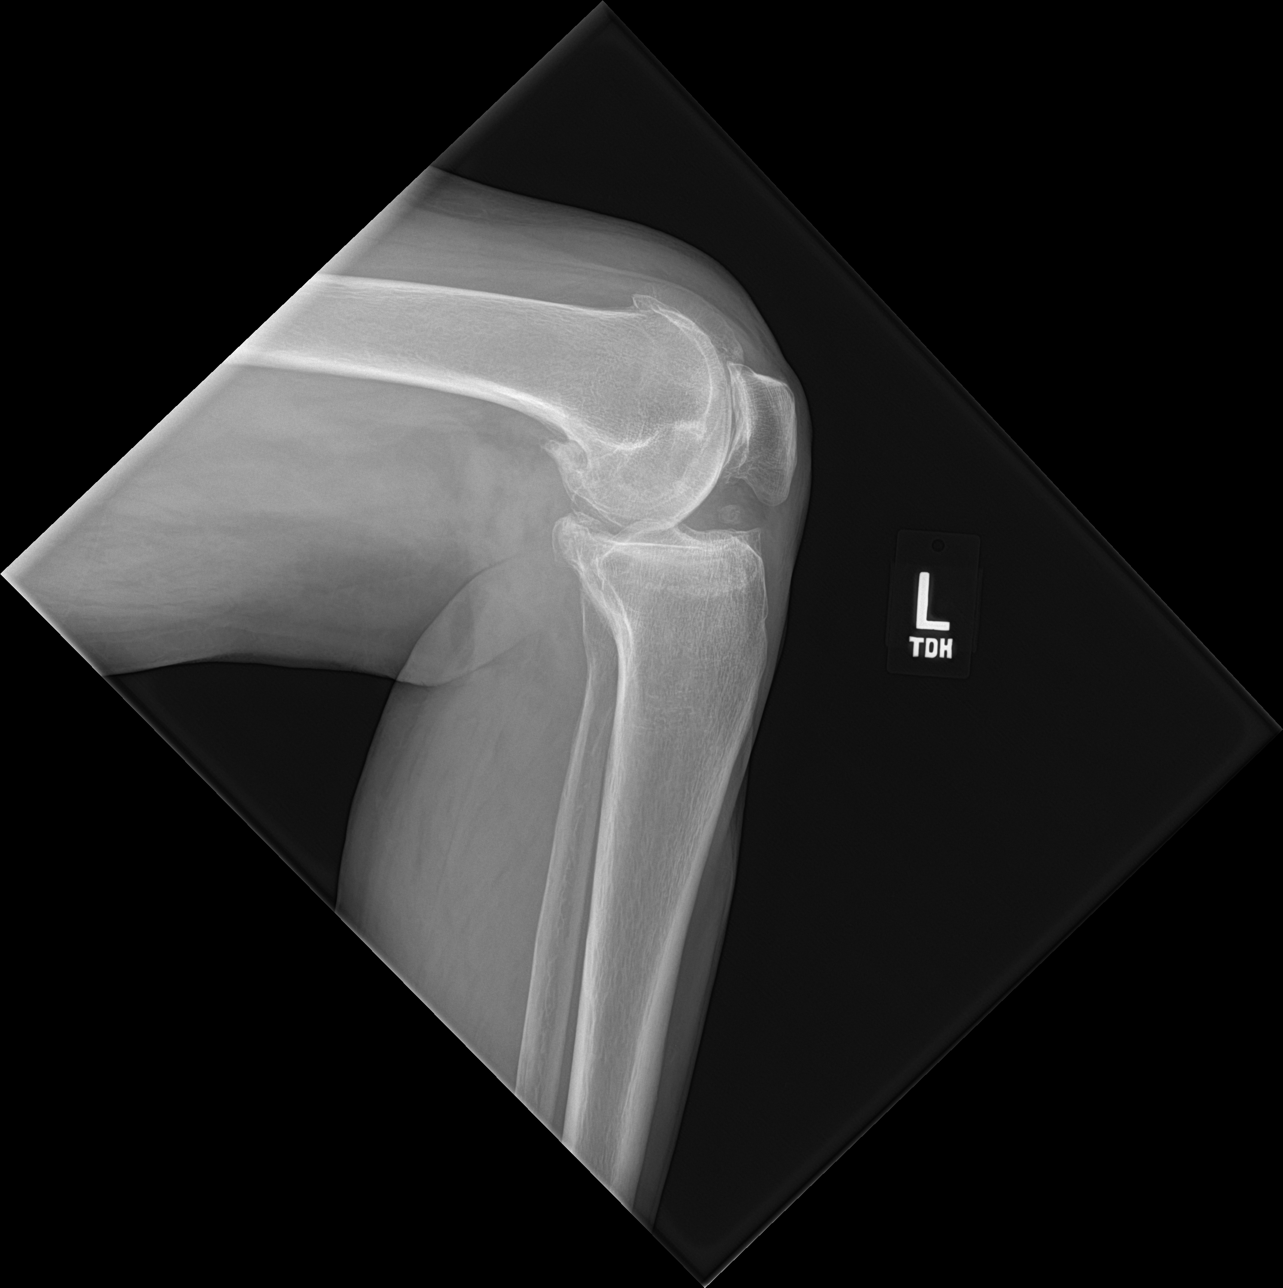

[knee obl (1 of 2)]
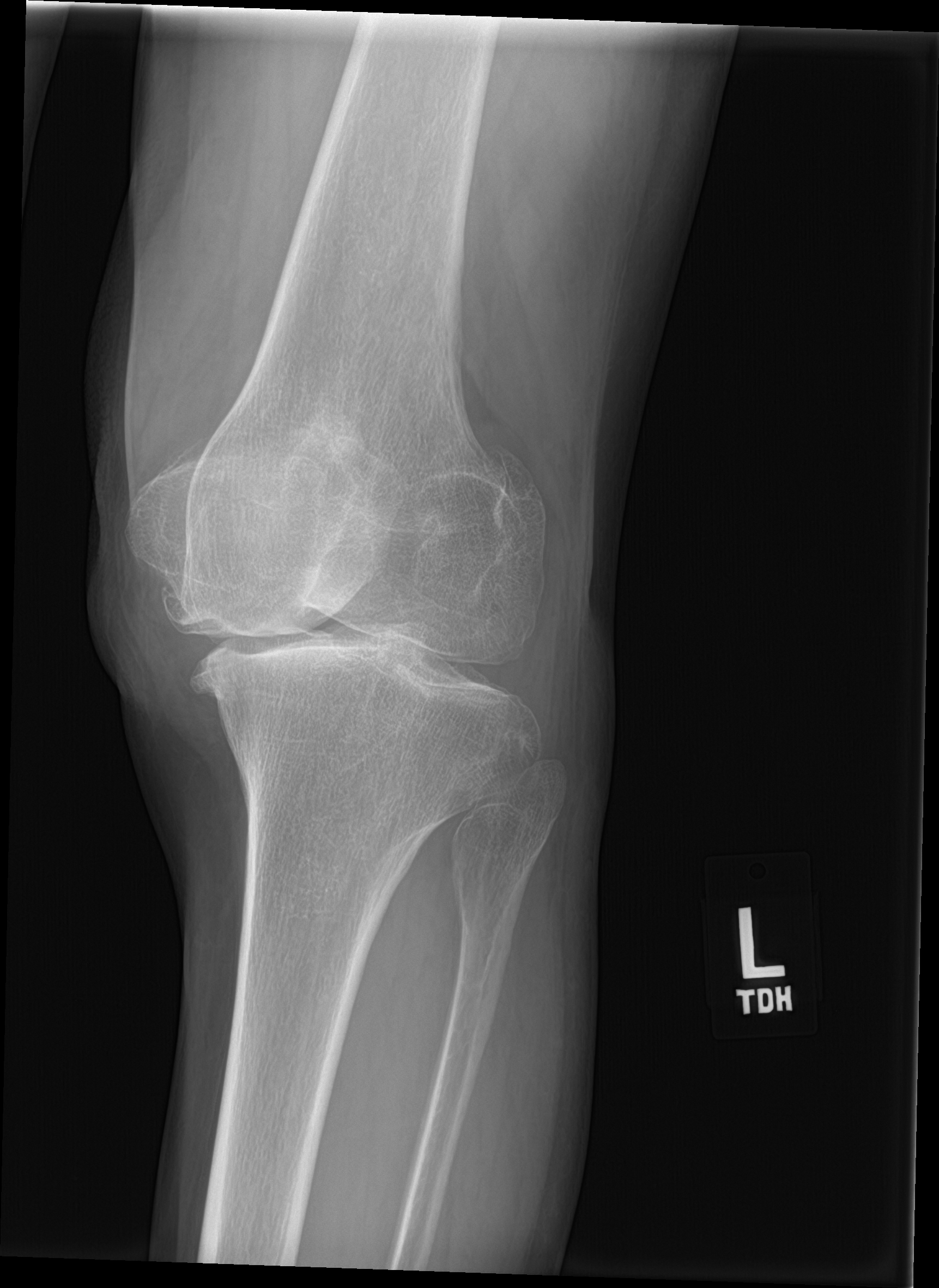

[knee obl (2 of 2)]
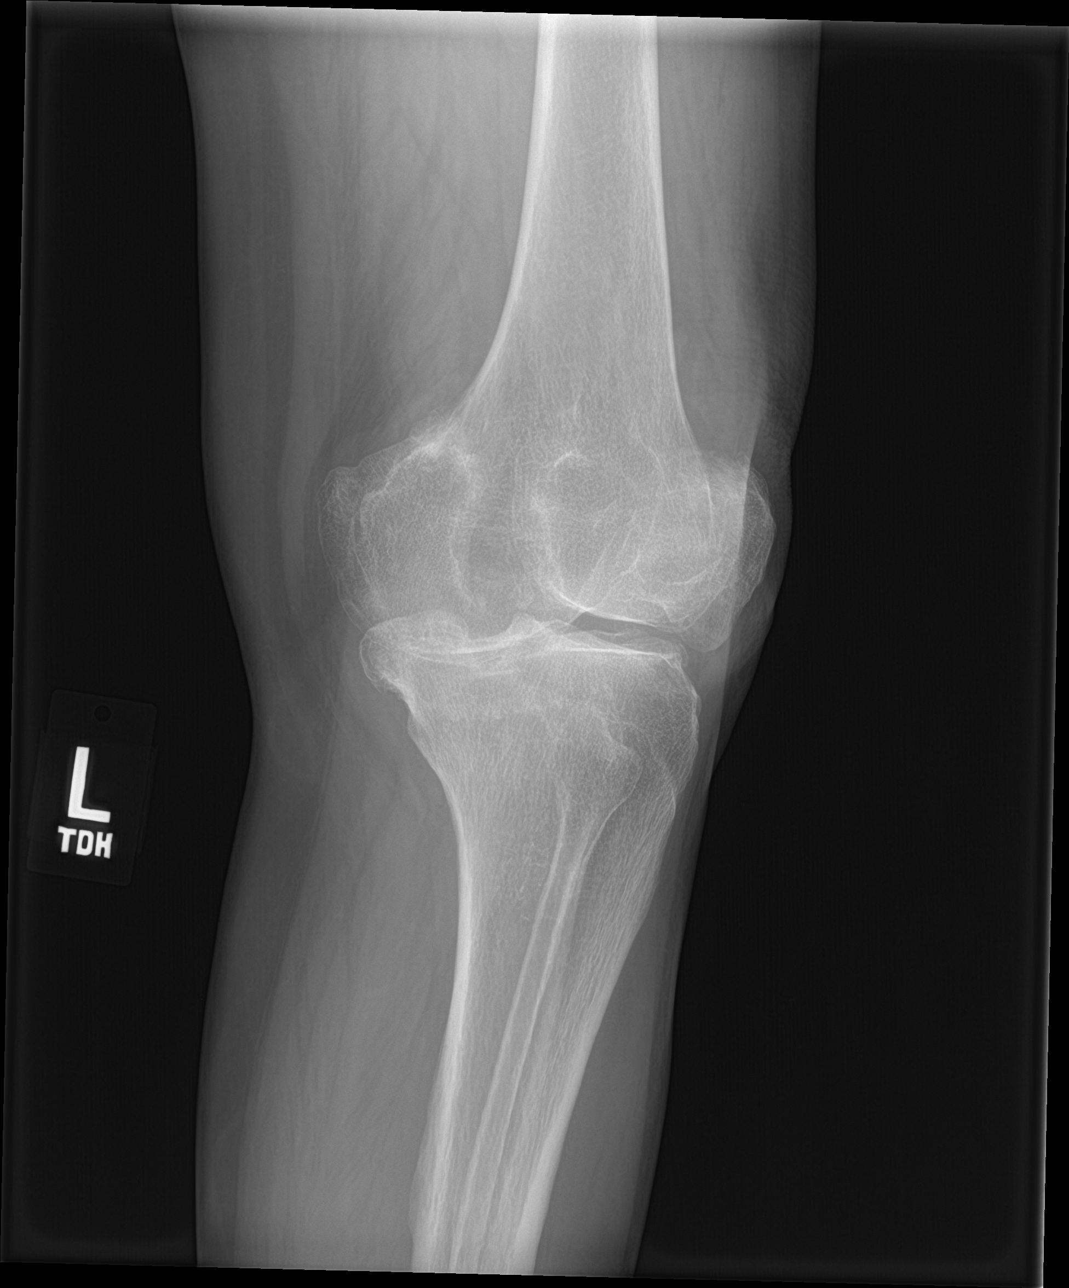

[4 of 4 positions shown; findings below may reference images not displayed]

FINDINGS: Degenerative changes in all 3 compartments of the left knee with
joint space narrowing and spurring. No acute bony abnormality.
Specifically, no fracture, subluxation, or dislocation. Soft tissues
are intact. No joint effusion.
IMPRESSION: Moderate tricompartment degenerative changes. No acute bony
abnormality.

## 2017-09-05 ENCOUNTER — Other Ambulatory Visit: Payer: Self-pay | Admitting: *Deleted

## 2017-09-05 DIAGNOSIS — Z1231 Encounter for screening mammogram for malignant neoplasm of breast: Secondary | ICD-10-CM
